# Patient Record
Sex: Male | Born: 1985 | Race: White | Hispanic: Yes | Marital: Single | Smoking: Current some day smoker
Health system: Southern US, Community
[De-identification: ages and names within clinical notes are randomized; demographics above are authoritative.]

## PROBLEM LIST (undated history)

## (undated) ENCOUNTER — Emergency Department (HOSPITAL_COMMUNITY): Admission: EM | Payer: Self-pay | Source: Home / Self Care

---

## 2005-06-28 ENCOUNTER — Emergency Department (HOSPITAL_COMMUNITY): Admission: EM | Admit: 2005-06-28 | Discharge: 2005-06-28 | Payer: Self-pay | Admitting: Emergency Medicine

## 2014-06-27 ENCOUNTER — Emergency Department (HOSPITAL_COMMUNITY)
Admission: EM | Admit: 2014-06-27 | Discharge: 2014-06-28 | Disposition: A | Discharge status: Home / Self Care | Payer: No Typology Code available for payment source | Attending: Emergency Medicine | Admitting: Emergency Medicine

## 2014-06-27 DIAGNOSIS — Z79899 Other long term (current) drug therapy: Secondary | ICD-10-CM | POA: Diagnosis not present

## 2014-06-27 DIAGNOSIS — S4992XA Unspecified injury of left shoulder and upper arm, initial encounter: Secondary | ICD-10-CM | POA: Insufficient documentation

## 2014-06-27 DIAGNOSIS — Y998 Other external cause status: Secondary | ICD-10-CM | POA: Insufficient documentation

## 2014-06-27 DIAGNOSIS — S199XXA Unspecified injury of neck, initial encounter: Secondary | ICD-10-CM | POA: Diagnosis not present

## 2014-06-27 DIAGNOSIS — Y9389 Activity, other specified: Secondary | ICD-10-CM | POA: Insufficient documentation

## 2014-06-27 DIAGNOSIS — Z720 Tobacco use: Secondary | ICD-10-CM | POA: Insufficient documentation

## 2014-06-27 DIAGNOSIS — Y92410 Unspecified street and highway as the place of occurrence of the external cause: Secondary | ICD-10-CM | POA: Insufficient documentation

## 2014-06-28 ENCOUNTER — Emergency Department (HOSPITAL_COMMUNITY): Payer: No Typology Code available for payment source

## 2014-06-28 ENCOUNTER — Encounter (HOSPITAL_COMMUNITY): Payer: Self-pay | Admitting: Emergency Medicine

## 2014-06-28 MED ORDER — NAPROXEN 500 MG PO TABS: 500.0000 mg | ORAL_TABLET | Freq: Two times a day (BID) | ORAL | Status: DC

## 2014-06-28 MED ORDER — HYDROCODONE-ACETAMINOPHEN 5-325 MG PO TABS
1.0000 | ORAL_TABLET | ORAL | Status: DC | PRN
Start: 2014-06-28 — End: 2018-03-10

## 2014-06-28 MED ORDER — NAPROXEN 500 MG PO TABS
500.0000 mg | ORAL_TABLET | Freq: Once | ORAL | Status: AC
  Administered 2014-06-28: 500 mg via ORAL
  Filled 2014-06-28: qty 1
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
## 2014-06-28 NOTE — ED Provider Notes (Signed)
Medical screening examination/treatment/procedure(s) were performed by non-physician practitioner and as supervising physician I was immediately available for consultation/collaboration.   EKG Interpretation None       Breon Rehm K Tracie Lindbloom-Rasch, MD 06/28/14 1426

## 2014-06-28 NOTE — ED Notes (Signed)
Pt ambulates well independently with a steady gait.
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
## 2014-06-28 NOTE — Discharge Instructions (Signed)
Follow directions provided. Be sure to establish care with a primary care provider to ensure you're getting better. He may use the naproxen twice a day for pain and inflammation. He may use the Vicodin for pain not relieved by the naproxen. Don't hesitate to return for any new, worsening, or concerning symptoms. ° °SEEK IMMEDIATE MEDICAL CARE IF:  °You have numbness, tingling, or weakness in the arms or legs.  °You develop severe headaches not relieved with medicine.  °You have severe neck pain, especially tenderness in the middle of the back of your neck.  °You have changes in bowel or bladder control.  °There is increasing pain in any area of the body.  °You have shortness of breath, light-headedness, dizziness, or fainting.  °You have chest pain.  °You feel sick to your stomach (nauseous), throw up (vomit), or sweat.  °You have increasing abdominal discomfort.  °There is blood in your urine, stool, or vomit.  °You have pain in your shoulder (shoulder strap areas).  °You feel your symptoms are getting worse. ° °
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
## 2018-03-10 ENCOUNTER — Emergency Department (HOSPITAL_COMMUNITY): Payer: Self-pay | Admitting: Anesthesiology

## 2018-03-10 ENCOUNTER — Encounter (HOSPITAL_COMMUNITY)
Admission: EM | Disposition: A | Discharge status: Home / Self Care | Payer: Self-pay | Source: Home / Self Care | Attending: Emergency Medicine

## 2018-03-10 ENCOUNTER — Emergency Department (HOSPITAL_COMMUNITY)
Admission: EM | Admit: 2018-03-10 | Discharge: 2018-03-10 | Disposition: A | Discharge status: Home / Self Care | Payer: Self-pay | Attending: Emergency Medicine | Admitting: Emergency Medicine

## 2018-03-10 DIAGNOSIS — J95830 Postprocedural hemorrhage and hematoma of a respiratory system organ or structure following a respiratory system procedure: Secondary | ICD-10-CM | POA: Insufficient documentation

## 2018-03-10 DIAGNOSIS — F17200 Nicotine dependence, unspecified, uncomplicated: Secondary | ICD-10-CM | POA: Insufficient documentation

## 2018-03-10 DIAGNOSIS — Z7982 Long term (current) use of aspirin: Secondary | ICD-10-CM | POA: Insufficient documentation

## 2018-03-10 DIAGNOSIS — Y838 Other surgical procedures as the cause of abnormal reaction of the patient, or of later complication, without mention of misadventure at the time of the procedure: Secondary | ICD-10-CM | POA: Insufficient documentation

## 2018-03-10 HISTORY — PX: TONSILLECTOMY: SHX5217

## 2018-03-10 LAB — CBC WITH DIFFERENTIAL/PLATELET
Basophils Absolute: 0.1 10*3/uL (ref 0.0–0.1)
Basophils Relative: 0 %
Eosinophils Absolute: 0.1 10*3/uL (ref 0.0–0.7)
Eosinophils Relative: 0 %
HCT: 42 % (ref 39.0–52.0)
Hemoglobin: 14.4 g/dL (ref 13.0–17.0)
Lymphocytes Relative: 17 %
Lymphs Abs: 3.6 10*3/uL (ref 0.7–4.0)
MCH: 31.5 pg (ref 26.0–34.0)
MCHC: 34.3 g/dL (ref 30.0–36.0)
MCV: 91.9 fL (ref 78.0–100.0)
Monocytes Absolute: 2.1 10*3/uL — ABNORMAL HIGH (ref 0.1–1.0)
Monocytes Relative: 10 %
Neutro Abs: 15.3 10*3/uL — ABNORMAL HIGH (ref 1.7–7.7)
Neutrophils Relative %: 73 %
Platelets: 316 10*3/uL (ref 150–400)
RBC: 4.57 MIL/uL (ref 4.22–5.81)
RDW: 12.8 % (ref 11.5–15.5)
WBC: 21.1 10*3/uL — ABNORMAL HIGH (ref 4.0–10.5)

## 2018-03-10 LAB — BASIC METABOLIC PANEL
Anion gap: 11 (ref 5–15)
BUN: 15 mg/dL (ref 6–20)
CO2: 31 mmol/L (ref 22–32)
Calcium: 8.8 mg/dL — ABNORMAL LOW (ref 8.9–10.3)
Chloride: 96 mmol/L — ABNORMAL LOW (ref 98–111)
Creatinine, Ser: 0.97 mg/dL (ref 0.61–1.24)
GFR calc Af Amer: >60 mL/min (ref >60)
GFR calc non Af Amer: >60 mL/min (ref >60)
Glucose, Bld: 166 mg/dL — ABNORMAL HIGH (ref 70–99)
Potassium: 3.5 mmol/L (ref 3.5–5.1)
Sodium: 138 mmol/L (ref 135–145)

## 2018-03-10 SURGERY — TONSILLECTOMY
Anesthesia: General | Site: Throat | Laterality: Left

## 2018-03-10 MED ORDER — PROMETHAZINE HCL 25 MG/ML IJ SOLN
12.5000 mg | Freq: Once | INTRAMUSCULAR | Status: AC
  Administered 2018-03-10: 12.5 mg via INTRAVENOUS
  Filled 2018-03-10: qty 1

## 2018-03-10 MED ORDER — SODIUM CHLORIDE 0.9 % IV BOLUS
1000.0000 mL | Freq: Once | INTRAVENOUS | Status: AC
  Administered 2018-03-10: 1000 mL via INTRAVENOUS

## 2018-03-10 MED ORDER — ONDANSETRON HCL 4 MG/2ML IJ SOLN
INTRAMUSCULAR | Status: AC
  Filled 2018-03-10: qty 2

## 2018-03-10 MED ORDER — LIDOCAINE-EPINEPHRINE (PF) 2 %-1:200000 IJ SOLN
10.0000 mL | Freq: Once | INTRAMUSCULAR | Status: AC
  Administered 2018-03-10: 10 mL
  Filled 2018-03-10: qty 20

## 2018-03-10 MED ORDER — LIDOCAINE HCL 4 % EX SOLN
CUTANEOUS | Status: DC | PRN
  Administered 2018-03-10 (×2): 5 mL via TOPICAL

## 2018-03-10 MED ORDER — ACETAMINOPHEN 10 MG/ML IV SOLN: 1000.0000 mg | Freq: Once | INTRAVENOUS | Status: DC | PRN

## 2018-03-10 MED ORDER — CLINDAMYCIN PHOSPHATE 900 MG/50ML IV SOLN
INTRAVENOUS | Status: AC
  Filled 2018-03-10: qty 50

## 2018-03-10 MED ORDER — FENTANYL CITRATE (PF) 100 MCG/2ML IJ SOLN: INTRAMUSCULAR | Status: DC | PRN

## 2018-03-10 MED ORDER — MIDAZOLAM HCL 2 MG/2ML IJ SOLN
INTRAMUSCULAR | Status: AC
  Filled 2018-03-10: qty 2

## 2018-03-10 MED ORDER — SUCCINYLCHOLINE CHLORIDE 200 MG/10ML IV SOSY
PREFILLED_SYRINGE | INTRAVENOUS | Status: DC | PRN
  Administered 2018-03-10: 120 mg via INTRAVENOUS

## 2018-03-10 MED ORDER — MIDAZOLAM HCL 5 MG/5ML IJ SOLN
INTRAMUSCULAR | Status: DC | PRN
  Administered 2018-03-10: 2 mg via INTRAVENOUS

## 2018-03-10 MED ORDER — LACTATED RINGERS IV SOLN
INTRAVENOUS | Status: DC
  Administered 2018-03-10 (×2): via INTRAVENOUS

## 2018-03-10 MED ORDER — HYDROMORPHONE HCL 1 MG/ML IJ SOLN
0.5000 mg | Freq: Once | INTRAMUSCULAR | Status: AC
  Administered 2018-03-10: 0.5 mg via INTRAVENOUS
  Filled 2018-03-10: qty 1

## 2018-03-10 MED ORDER — DEXTROSE-NACL 5-0.45 % IV SOLN: INTRAVENOUS | Status: DC

## 2018-03-10 MED ORDER — ONDANSETRON HCL 4 MG PO TABS
4.0000 mg | ORAL_TABLET | ORAL | Status: DC | PRN
  Filled 2018-03-10: qty 1

## 2018-03-10 MED ORDER — PROPOFOL 10 MG/ML IV BOLUS
INTRAVENOUS | Status: AC
  Filled 2018-03-10: qty 20

## 2018-03-10 MED ORDER — DEXMEDETOMIDINE HCL 200 MCG/2ML IV SOLN
INTRAVENOUS | Status: DC | PRN
  Administered 2018-03-10: 40 ug via INTRAVENOUS
  Administered 2018-03-10: 20 ug via INTRAVENOUS

## 2018-03-10 MED ORDER — MEPERIDINE HCL 50 MG/ML IJ SOLN: 6.2500 mg | INTRAMUSCULAR | Status: DC | PRN

## 2018-03-10 MED ORDER — ONDANSETRON HCL 4 MG/2ML IJ SOLN
4.0000 mg | Freq: Once | INTRAMUSCULAR | Status: AC
  Administered 2018-03-10: 4 mg via INTRAVENOUS
  Filled 2018-03-10: qty 2

## 2018-03-10 MED ORDER — HYDROCODONE-ACETAMINOPHEN 7.5-325 MG PO TABS: 1.0000 | ORAL_TABLET | Freq: Once | ORAL | Status: DC | PRN

## 2018-03-10 MED ORDER — SODIUM CHLORIDE 0.9 % IV SOLN
Freq: Once | INTRAVENOUS | Status: AC
  Administered 2018-03-10: 09:00:00 via INTRAVENOUS

## 2018-03-10 MED ORDER — PROMETHAZINE HCL 25 MG/ML IJ SOLN: 6.2500 mg | INTRAMUSCULAR | Status: DC | PRN

## 2018-03-10 MED ORDER — HYDROMORPHONE HCL 1 MG/ML IJ SOLN
INTRAMUSCULAR | Status: AC
  Filled 2018-03-10: qty 1

## 2018-03-10 MED ORDER — ONDANSETRON HCL 4 MG/2ML IJ SOLN: 4.0000 mg | INTRAMUSCULAR | Status: DC | PRN

## 2018-03-10 MED ORDER — LIDOCAINE-EPINEPHRINE 0.5 %-1:200000 IJ SOLN
INTRAMUSCULAR | Status: AC
  Filled 2018-03-10: qty 1

## 2018-03-10 MED ORDER — LACTATED RINGERS IV SOLN: 100.0000 mL | INTRAVENOUS | 0 refills | Status: AC

## 2018-03-10 MED ORDER — PROPOFOL 10 MG/ML IV BOLUS
INTRAVENOUS | Status: DC | PRN
  Administered 2018-03-10: 30 mg via INTRAVENOUS
  Administered 2018-03-10: 20 mg via INTRAVENOUS
  Administered 2018-03-10: 50 mg via INTRAVENOUS

## 2018-03-10 MED ORDER — HYDROMORPHONE HCL 1 MG/ML IJ SOLN
0.2500 mg | INTRAMUSCULAR | Status: DC | PRN
  Administered 2018-03-10: 0.5 mg via INTRAVENOUS

## 2018-03-10 MED ORDER — MORPHINE SULFATE (PF) 4 MG/ML IV SOLN
4.0000 mg | Freq: Once | INTRAVENOUS | Status: AC
  Administered 2018-03-10: 4 mg via INTRAVENOUS
  Filled 2018-03-10: qty 1

## 2018-03-10 MED ORDER — "TRANEXAMIC ACID 5% ORAL SOLUTION "
10.0000 mL | Freq: Once | ORAL | Status: AC
  Administered 2018-03-10: 10 mL via OROMUCOSAL
  Filled 2018-03-10: qty 10

## 2018-03-10 MED ORDER — DEXAMETHASONE SODIUM PHOSPHATE 10 MG/ML IJ SOLN
INTRAMUSCULAR | Status: DC | PRN
  Administered 2018-03-10: 10 mg via INTRAVENOUS

## 2018-03-10 MED ORDER — ONDANSETRON HCL 4 MG/2ML IJ SOLN
INTRAMUSCULAR | Status: DC | PRN
  Administered 2018-03-10: 4 mg via INTRAVENOUS

## 2018-03-10 MED ORDER — 0.9 % SODIUM CHLORIDE (POUR BTL) OPTIME
TOPICAL | Status: DC | PRN
  Administered 2018-03-10: 1000 mL

## 2018-03-10 MED ORDER — FENTANYL CITRATE (PF) 100 MCG/2ML IJ SOLN
INTRAMUSCULAR | Status: AC
  Filled 2018-03-10: qty 2

## 2018-03-10 MED ORDER — CLINDAMYCIN PHOSPHATE 900 MG/50ML IV SOLN
900.0000 mg | INTRAVENOUS | Status: AC
  Administered 2018-03-10: 900 mg via INTRAVENOUS

## 2018-03-10 MED ORDER — OXYCODONE HCL 5 MG/5ML PO SOLN: 5.0000 mg | ORAL | Status: DC | PRN

## 2018-03-10 SURGICAL SUPPLY — 19 items
CATH ROBINSON RED A/P 10FR (CATHETERS) ×1 IMPLANT
CLEANER TIP ELECTROSURG 2X2 (MISCELLANEOUS) ×1 IMPLANT
COVER SURGICAL LIGHT HANDLE (MISCELLANEOUS) ×3 IMPLANT
ELECT COATED BLADE 2.86 ST (ELECTRODE) ×1 IMPLANT
ELECT REM PT RETURN 15FT ADLT (MISCELLANEOUS) ×3 IMPLANT
GAUZE 4X4 16PLY RFD (DISPOSABLE) ×3 IMPLANT
GLOVE ECLIPSE 8.0 STRL XLNG CF (GLOVE) ×3 IMPLANT
KIT BASIN OR (CUSTOM PROCEDURE TRAY) ×3 IMPLANT
NDL SPNL 22GX3.5 QUINCKE BK (NEEDLE) ×1 IMPLANT
NEEDLE SPNL 22GX3.5 QUINCKE BK (NEEDLE) IMPLANT
NS IRRIG 1000ML POUR BTL (IV SOLUTION) ×3 IMPLANT
PACK BASIC VI WITH GOWN DISP (CUSTOM PROCEDURE TRAY) ×3 IMPLANT
PENCIL FOOT CONTROL (ELECTRODE) ×3 IMPLANT
SPONGE TONSIL TAPE 1 RFD (DISPOSABLE) ×4 IMPLANT
SWAB COLLECTION DEVICE MRSA (MISCELLANEOUS) ×2 IMPLANT
SWAB CULTURE ESWAB REG 1ML (MISCELLANEOUS) ×2 IMPLANT
SYR BULB 3OZ (MISCELLANEOUS) ×3 IMPLANT
WATER STERILE IRR 1000ML POUR (IV SOLUTION) ×3 IMPLANT
YANKAUER SUCT BULB TIP 10FT TU (MISCELLANEOUS) ×5 IMPLANT

## 2018-03-10 NOTE — Anesthesia Preprocedure Evaluation (Signed)
Anesthesia Evaluation  Patient identified by MRN, date of birth, ID band Patient awake    Reviewed: Allergy & Precautions, NPO status , Patient's Chart, lab work & pertinent test results  Airway Mallampati: IV  TM Distance: >3 FB Neck ROM: Full    Dental no notable dental hx. (+) Dental Advisory Given, Teeth Intact   Pulmonary neg pulmonary ROS,    Pulmonary exam normal breath sounds clear to auscultation       Cardiovascular Exercise Tolerance: Good negative cardio ROS Normal cardiovascular exam Rhythm:Regular Rate:Normal     Neuro/Psych negative neurological ROS  negative psych ROS   GI/Hepatic   Endo/Other  negative endocrine ROS  Renal/GU      Musculoskeletal   Abdominal (+) + obese,   Peds  Hematology   Anesthesia Other Findings   Reproductive/Obstetrics                             Anesthesia Physical Anesthesia Plan  ASA: II and emergent  Anesthesia Plan: General   Post-op Pain Management:    Induction: Intravenous  PONV Risk Score and Plan: 2 and Treatment may vary due to age or medical condition, Ondansetron and Dexamethasone  Airway Management Planned: Awake Intubation Planned and Video Laryngoscope Planned  Additional Equipment:   Intra-op Plan:   Post-operative Plan: Extubation in OR  Informed Consent: I have reviewed the patients History and Physical, chart, labs and discussed the procedure including the risks, benefits and alternatives for the proposed anesthesia with the patient or authorized representative who has indicated his/her understanding and acceptance.   Dental advisory given  Plan Discussed with: CRNA  Anesthesia Plan Comments:         Anesthesia Quick Evaluation

## 2018-03-10 NOTE — Discharge Instructions (Signed)
See tonsillectomy instructions from your other doctor. Ibuprofen, Tylenol, and Oxycodone for pain relief. Rinse throat with cool dilute salt water to clear old blood and thick phlegm Drink plenty of fluids.  Advance to solid foods when comfortable Call for further bleeding, excessive pain.  [phone]

## 2018-03-10 NOTE — ED Notes (Signed)
Bed: WA17 Expected date:  Expected time:  Means of arrival:  Comments: EMS/hematemesis

## 2018-03-10 NOTE — ED Triage Notes (Signed)
Pt via [facility]. Pt reports having tonsils removed at another facility two days ago.  Pt reports coughing up blood starting appx 0600 today.

## 2018-03-10 NOTE — Op Note (Signed)
03/10/2018  2:11 PM    Ethan Cameron  [id number]   Pre-Op Dx:  Post tonsillectomy hemorrhage, left  Post-op Dx:  same  Proc: Electrocautery, left tonsillectomy hemorrhage.  Evacuation of old blood in the stomach.    Surg:  Flo Shanks T MD  Anes:  GOT  EBL: Minimal.  400+ cc in stomach.  This  Comp: None.  Findings: Open tonsil fossae.  A large clot in the left tonsil fossa with a superior pole arteriolar bleeding vessel.  Extensive old blood in the stomach.  Procedure: With the patient in a comfortable supine position, general orotracheal anesthesia was induced without difficulty.  A routine surgical timeout was obtained.  Table was turned 90 degrees and the patient placed in Trendelenburg.  A clean preparation and draping was accomplished.  Taking care to clinic lips, teeth, and endotracheal tube, the [employer] mouth gag  was introduced, expanded for visualization, and suspended from the [employer] stand in the standard fashion.  The findings were as described above.   A large clot was evacuated from this and was the left tonsil fossa.   frank bleeding  was encountered and controlled with electrocautery.    An 87 French nasogastric tube was placed several times large quantities of old one stomach.  Irrigation was used to enhance this process.    Upon incomplete evacuation of the stomach, mouth gag was relaxed for several minutes.  Upon reexpansion, the left tonsil fossa was hemostatic. the right fossa was also hemostatic.  At this point his mouth gag was relaxed and removed.  The dental status was intact.  Patient was returned to anesthesia, awakened, extubated, and transferred to recovery in stable condition.    Dispo:   PACU to home  Plan: Analgesia, hydration, advance diet.    Cephus Richer MD

## 2018-03-10 NOTE — ED Notes (Signed)
ED Provider at bedside. 

## 2018-03-10 NOTE — Anesthesia Procedure Notes (Signed)
Procedure Name: Intubation Date/Time: 03/10/2018 1:40 PM Performed by: Wynonia Sours, CRNA Pre-anesthesia Checklist: Patient identified, Emergency Drugs available, Suction available, Patient being monitored and Timeout performed Patient Re-evaluated:Patient Re-evaluated prior to induction Oxygen Delivery Method: Circle system utilized Preoxygenation: Pre-oxygenation with 100% oxygen Induction Type: IV induction, Cricoid Pressure applied and Inhalational induction Laryngoscope Size: Glidescope and 4 Grade View: Grade I Tube type: Oral Tube size: 7.5 mm Airway Equipment and Method: Stylet and Video-laryngoscopy Placement Confirmation: ETT inserted through vocal cords under direct vision,  positive ETCO2,  CO2 detector and breath sounds checked- equal and bilateral Secured at: 22 cm Tube secured with: Tape Dental Injury: Teeth and Oropharynx as per pre-operative assessment  Comments: Patient given nebulized 4% Lidocaine in preop. Patient in OR. Given 2mg  Versed and Sevo inhalation induction. Glidescope utilized. Blood in pharynx. Dr Richardson Landry intubated VC. Tube marked.

## 2018-03-10 NOTE — ED Provider Notes (Addendum)
[facility]-EMERGENCY DEPT Provider Note   CSN: [id number] Arrival date & time: 03/10/18  0800     History   Chief Complaint No chief complaint on file.   HPI [name] is a 32 y.o. male.  HPI   32yM with bleeding after tonsillectomy two days ago. Done By Dr Judithann Graves in [place], [place]. Pt says currently visiting his girlfriend locally. Began coughing up blood around 0600 today. Persistent since then. Says he is otherwise fairly healthy. No ASA. Not anticoagulated. Feels nauseated.   No past medical history on file.  There are no active problems to display for this patient.   No past surgical history on file.      Home Medications    Prior to Admission medications   Medication Sig Start Date End Date Taking? Authorizing Provider  aspirin-acetaminophen-caffeine (EXCEDRIN MIGRAINE) [phone] MG per tablet Take 1-2 tablets by mouth every 6 (six) hours as needed for headache (pain).    [provider]  Biotin (BIOTIN 5000) 5 MG CAPS Take 5 mg by mouth daily.    [provider]  HYDROcodone-acetaminophen (NORCO/VICODIN) 5-325 MG per tablet Take 1-2 tablets by mouth every 4 (four) hours as needed for moderate pain or severe pain. 06/28/14   Harle Battiest, NP  naproxen (NAPROSYN) 500 MG tablet Take 1 tablet (500 mg total) by mouth 2 (two) times daily. 06/28/14   Harle Battiest, NP  OVER THE COUNTER MEDICATION Take 1 tablet by mouth daily as needed (for energy boost). [employer]    [provider]    Family History No family history on file.  Social History Social History   Tobacco Use  . Smoking status: Current Some Day Smoker  . Smokeless tobacco: Never Used  Substance Use Topics  . Alcohol use: Yes    Comment: occasionally  . Drug use: No     Allergies   Patient has no known allergies.   Review of Systems Review of Systems  All systems reviewed and negative, other than as noted in HPI.  Physical  Exam Updated Vital Signs Pulse (!) 128   Resp 20   SpO2 95%   Physical Exam  Constitutional: He appears well-developed.  Sitting up in bed. Spitting blood into emesis bag. Looks tired.   HENT:  Head: Normocephalic.  Mouth/Throat:    Eschar on what I can visual of R tonsillar bed. Eschar of superior aspect of L tonsillar bed but clot noted below it with mild oozing of blood.   Eyes: Conjunctivae are normal. Right eye exhibits no discharge. Left eye exhibits no discharge.  Neck: Neck supple.  Cardiovascular: Regular rhythm and normal heart sounds. Exam reveals no gallop and no friction rub.  No murmur heard. tachcyardic  Pulmonary/Chest: Effort normal and breath sounds normal. No respiratory distress.  Abdominal: Soft. He exhibits no distension. There is no tenderness.  Musculoskeletal: He exhibits no edema or tenderness.  Skin: Skin is warm and dry.  Psychiatric: He has a normal mood and affect. His behavior is normal. Thought content normal.  Nursing note and vitals reviewed.    ED Treatments / Results  Labs (all labs ordered are listed, but only abnormal results are displayed) Labs Reviewed  BASIC METABOLIC PANEL - Abnormal; Notable for the following components:      Result Value   Chloride 96 (*)    Glucose, Bld 166 (*)    Calcium 8.8 (*)    All other components within normal limits  CBC  WITH DIFFERENTIAL/PLATELET - Abnormal; Notable for the following components:   WBC 21.1 (*)    Neutro Abs 15.3 (*)    Monocytes Absolute 2.1 (*)    All other components within normal limits    EKG None  Radiology No results found.  Procedures Procedures (including critical care time)  CRITICAL CARE Performed by: Raeford Razor Total critical care time: 35 minutes Critical care time was exclusive of separately billable procedures and treating other patients. Critical care was necessary to treat or prevent imminent or life-threatening deterioration. Post-tonsillectomy bleed.  HR 130. Requiring emergent ENT consultation and definitve operative management.  Critical care was time spent personally by me on the following activities: development of treatment plan with patient and/or surrogate as well as nursing, discussions with consultants, evaluation of patient's response to treatment, examination of patient, obtaining history from patient or surrogate, ordering and performing treatments and interventions, ordering and review of laboratory studies, ordering and review of radiographic studies, pulse oximetry and re-evaluation of patient's condition.   Medications Ordered in ED Medications  sodium chloride 0.9 % bolus 1,000 mL (1,000 mLs Intravenous New Bag/Given 03/10/18 0823)  tranexamic acid 5% oral solution for E.D. (has no administration in time range)  morphine 4 MG/ML injection 4 mg (has no administration in time range)  lidocaine-EPINEPHrine (XYLOCAINE W/EPI) 2 %-1:200000 (PF) injection 10 mL (has no administration in time range)     Initial Impression / Assessment and Plan / ED Course  I have reviewed the triage vital signs and the nursing notes.  Pertinent labs & imaging results that were available during my care of the patient were reviewed by me and considered in my medical decision making (see chart for details).     32yM with post-tonsillectomy bleed. Bleeding appears to be coming from L side. There is some eschar visible on superior aspect of tonsillar bed, but oozing from below it.   Pt is tachycardic with HR to 130s, but I suspect that this is more from poor PO intake after surgery than blood loss. IV placed. He was moved to resus room. Will try tranexamic acid. Bleeding actually seems to have improved since I initially evaluated. Will apply pressure with epi/lido soaked gauze if needed. ENT consulted to see patient in the ED particularly since his surgeon is 2 hours away for follow-up.   10:34 AM Pt posted to OR in a couple hours. Girlfriend concerned  because tongue appears more swollen to her. It is enlarged but his exam is essentially unchanged from my initial one aside from bleeding looking better at this point. He is requesting additional pain medication.   Final Clinical Impressions(s) / ED Diagnoses   Final diagnoses:  Post-tonsillectomy hemorrhage    ED Discharge Orders    None         Raeford Razor, MD 03/10/18 1049

## 2018-03-10 NOTE — Anesthesia Postprocedure Evaluation (Signed)
Anesthesia Post Note  Patient: Ethan Cameron  Procedure(s) Performed: CAUTERIZATION OF BLEEDING TONSIL (Left Throat)     Patient location during evaluation: PACU Anesthesia Type: General Level of consciousness: awake and alert Pain management: pain level controlled Vital Signs Assessment: post-procedure vital signs reviewed and stable Respiratory status: spontaneous breathing, nonlabored ventilation, respiratory function stable and patient connected to nasal cannula oxygen Cardiovascular status: blood pressure returned to baseline and stable Postop Assessment: no apparent nausea or vomiting Anesthetic complications: no    Last Vitals:  Vitals:   03/10/18 1515 03/10/18 1518  BP: (!) 109/55   Pulse: (!) 106 (!) 116  Resp: 15 13  Temp:    SpO2: 97% 96%    Last Pain:  Vitals:   03/10/18 1515  TempSrc:   PainSc: Asleep                 Trevor Iha

## 2018-03-10 NOTE — Consult Note (Signed)
Ethan Cameron, Ethan Cameron 32 y.o., male [id number]     Chief Complaint: throat bleeding  HPI: 32 yo hispanic male underwent tonsillectomy 2 days ago in [place].  Began bleeding this AM.  Vomited lg amt blood. Continued bleeding.  ENT called.  No ASA.  No known bleeding issues. Typical post tonsillectomy pain, on Oxycodone and Tylenol.    PMH:No past medical history on file.  Surg Hx:No past surgical history on file.  FHx:  No family history on file. SocHx:  reports that he has been smoking. He has never used smokeless tobacco. He reports that he drinks alcohol. He reports that he does not use drugs.  ALLERGIES: No Known Allergies   (Not in a hospital admission)  Results for orders placed or performed during the hospital encounter of 03/10/18 (from the past 48 hour(s))  Basic metabolic panel     Status: Abnormal   Collection Time: 03/10/18  8:23 AM  Result Value Ref Range   Sodium 138 135 - 145 mmol/L   Potassium 3.5 3.5 - 5.1 mmol/L   Chloride 96 (L) 98 - 111 mmol/L   CO2 31 22 - 32 mmol/L   Glucose, Bld 166 (H) 70 - 99 mg/dL   BUN 15 6 - 20 mg/dL   Creatinine, Ser 0.97 0.61 - 1.24 mg/dL   Calcium 8.8 (L) 8.9 - 10.3 mg/dL   GFR calc non Af Amer >60 >60 mL/min   GFR calc Af Amer >60 >60 mL/min    Comment: (NOTE) The eGFR has been calculated using the CKD EPI equation. This calculation has not been validated in all clinical situations. eGFR's persistently <60 mL/min signify possible Chronic Kidney Disease.    Anion gap 11 5 - 15    Comment: Performed at [facility], [place] [address], [place], [place] [address]  CBC with Differential     Status: Abnormal   Collection Time: 03/10/18  8:23 AM  Result Value Ref Range   WBC 21.1 (H) 4.0 - 10.5 K/uL   RBC 4.57 4.22 - 5.81 MIL/uL   Hemoglobin 14.4 13.0 - 17.0 g/dL   HCT 42.0 39.0 - 52.0 %   MCV 91.9 78.0 - 100.0 fL   MCH 31.5 26.0 - 34.0 pg   MCHC 34.3 30.0 - 36.0 g/dL   RDW 12.8 11.5 - 15.5 %   Platelets 316 150 -  400 K/uL   Neutrophils Relative % 73 %   Neutro Abs 15.3 (H) 1.7 - 7.7 K/uL   Lymphocytes Relative 17 %   Lymphs Abs 3.6 0.7 - 4.0 K/uL   Monocytes Relative 10 %   Monocytes Absolute 2.1 (H) 0.1 - 1.0 K/uL   Eosinophils Relative 0 %   Eosinophils Absolute 0.1 0.0 - 0.7 K/uL   Basophils Relative 0 %   Basophils Absolute 0.1 0.0 - 0.1 K/uL    Comment: Performed at [facility], [place] [address], [place], [place] [address]   No results found.    Blood pressure (!) 169/93, pulse (!) 129, resp. rate 20, SpO2 94 %.  PHYSICAL EXAM: Overall appearance:  Distressed.  Breathing well.  Old blood in mouth.   Head:  NCAT Ears:  Not examined Nose:  Not examined Oral Cavity: bulky tongue.   Oral Pharynx/Hypopharynx/Larynx: old blood in pharynx.  No active bleeding seen. Neuro: grossly intact Neck:  clear  Studies Reviewed:none    Assessment/Plan Post tonsillectomy hemorrhage, LEFT  Plan:  To OR for cautery later this AM.  Discussed  with pt and girlfriend.  Questions answered and informed consent obtained.  Jodi Marble 08/10/1550, 9:19 AM

## 2018-03-10 NOTE — Transfer of Care (Signed)
Immediate Anesthesia Transfer of Care Note  Patient: Ethan Cameron  Procedure(s) Performed: CAUTERIZATION OF BLEEDING TONSIL (Left Throat)  Patient Location: PACU  Anesthesia Type:General  Level of Consciousness: awake and drowsy  Airway & Oxygen Therapy: Patient Spontanous Breathing and Patient connected to face mask oxygen  Post-op Assessment: Report given to RN and Post -op Vital signs reviewed and stable  Post vital signs: Reviewed and stable  Last Vitals:  Vitals Value Taken Time  BP 124/85 03/10/2018  2:30 PM  Temp    Pulse 121 03/10/2018  2:42 PM  Resp 27 03/10/2018  2:42 PM  SpO2 96 % 03/10/2018  2:42 PM  Vitals shown include unvalidated device data.  Last Pain:  Vitals:   03/10/18 1430  TempSrc:   PainSc: Asleep         Complications: No apparent anesthesia complications

## 2018-03-10 NOTE — ED Notes (Signed)
Pt oxygen saturations 90 on RA.  RN reminded pt to leave Nasal cannula in place to receive oxygen therapy.  Will continue to monitor.

## 2018-03-10 NOTE — ED Notes (Signed)
Pts girlfriend stated that pt's tongue has gotten more swollen since arrival to ED.  Pt O2 sats on 3L Nasal cannula 97%.  MD made aware.

## 2018-03-11 ENCOUNTER — Encounter (HOSPITAL_COMMUNITY): Payer: Self-pay | Admitting: Otolaryngology

## 2019-08-17 ENCOUNTER — Ambulatory Visit: Payer: Self-pay | Attending: Internal Medicine

## 2019-08-17 DIAGNOSIS — Z20822 Contact with and (suspected) exposure to covid-19: Secondary | ICD-10-CM

## 2019-08-19 LAB — NOVEL CORONAVIRUS, NAA: SARS-CoV-2, NAA: NOT DETECTED

## 2019-09-10 ENCOUNTER — Ambulatory Visit: Payer: Self-pay
